# Patient Record
Sex: Male | Born: 2000 | Hispanic: No | Marital: Single | Smoking: Never smoker
Health system: Southern US, Community
[De-identification: ages and names within clinical notes are randomized; demographics above are authoritative.]

---
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
## 2019-09-15 ENCOUNTER — Other Ambulatory Visit: Payer: Medicaid Other

## 2019-09-15 ENCOUNTER — Other Ambulatory Visit: Payer: Self-pay | Admitting: Sleep Medicine

## 2019-09-15 DIAGNOSIS — I471 Supraventricular tachycardia: Secondary | ICD-10-CM

## 2019-09-18 LAB — NOVEL CORONAVIRUS, NAA: SARS-CoV-2, NAA: NOT DETECTED

## 2019-12-19 ENCOUNTER — Other Ambulatory Visit: Payer: Self-pay

## 2019-12-19 DIAGNOSIS — Z20822 Contact with and (suspected) exposure to covid-19: Secondary | ICD-10-CM

## 2019-12-20 LAB — NOVEL CORONAVIRUS, NAA: SARS-CoV-2, NAA: NOT DETECTED

## 2019-12-20 LAB — SARS-COV-2, NAA 2 DAY TAT

## 2023-05-31 ENCOUNTER — Encounter (HOSPITAL_COMMUNITY): Payer: Self-pay

## 2023-05-31 ENCOUNTER — Ambulatory Visit (HOSPITAL_COMMUNITY)
Admission: EM | Admit: 2023-05-31 | Discharge: 2023-05-31 | Disposition: A | Discharge status: Home / Self Care | Attending: Physician Assistant | Admitting: Physician Assistant

## 2023-05-31 DIAGNOSIS — J029 Acute pharyngitis, unspecified: Secondary | ICD-10-CM | POA: Insufficient documentation

## 2023-05-31 DIAGNOSIS — J069 Acute upper respiratory infection, unspecified: Secondary | ICD-10-CM | POA: Diagnosis not present

## 2023-05-31 LAB — POC SARS CORONAVIRUS 2 AG -  ED: SARS Coronavirus 2 Ag: NEGATIVE

## 2023-05-31 LAB — POCT RAPID STREP A (OFFICE): Rapid Strep A Screen: NEGATIVE

## 2023-05-31 MED ORDER — IBUPROFEN 600 MG PO TABS: 600.0000 mg | ORAL_TABLET | Freq: Three times a day (TID) | ORAL | 0 refills | Status: AC | PRN

## 2023-05-31 MED ORDER — PROMETHAZINE-DM 6.25-15 MG/5ML PO SYRP: 5.0000 mL | ORAL_SOLUTION | Freq: Two times a day (BID) | ORAL | 0 refills | Status: AC | PRN

## 2023-05-31 MED ORDER — CETIRIZINE HCL 10 MG PO TABS: 10.0000 mg | ORAL_TABLET | Freq: Every day | ORAL | 0 refills | Status: AC

## 2023-05-31 NOTE — ED Provider Notes (Signed)
 [facility]    CSN: [id number] Arrival date & time: 05/31/23  1014      History   Chief Complaint Chief Complaint  Patient presents with   Sore Throat    HPI Scott Jimenez is a 23 y.o. male.   Patient presents today with a 4-day history of URI symptoms.  He reports initially having a fever but this is since resolved.  He has had congestion, cough, sore throat.  Sore throat pain is rated 5 on a 0-10 pain scale, described as aching, no aggravating alleviating factors identified.  He is eating and drinking normally.  He does report some hoarseness but this is also improved.  Denies any known sick contacts.  He has been taking Mucinex, DayQuil, NyQuil without improvement of symptoms.  He has had COVID last year.  He has had COVID vaccines with booster but has not had most recent booster.  Denies any significant past medical history including allergies, asthma, COPD, diabetes, smoking.    History reviewed. No pertinent past medical history.  There are no active problems to display for this patient.   History reviewed. No pertinent surgical history.     Home Medications    Prior to Admission medications   Medication Sig Start Date End Date Taking? Authorizing Provider  cetirizine (ZYRTEC ALLERGY) 10 MG tablet Take 1 tablet (10 mg total) by mouth daily. 05/31/23  Yes Neilani Duffee K, PA-C  promethazine-dextromethorphan (PROMETHAZINE-DM) 6.25-15 MG/5ML syrup Take 5 mLs by mouth 2 (two) times daily as needed for cough. 05/31/23  Yes Franko Hilliker K, PA-C  ibuprofen  (ADVIL ) 600 MG tablet Take 1 tablet (600 mg total) by mouth every 8 (eight) hours as needed for moderate pain (pain score 4-6). 05/31/23   Rylea Selway, Betsey Brow, PA-C    Family History Family History  Problem Relation Age of Onset   Healthy Mother    Healthy Father     Social History Social History   Tobacco Use   Smoking status: Never  Vaping Use   Vaping status: Never Used  Substance Use Topics    Alcohol use: No   Drug use: No     Allergies   Patient has no known allergies.   Review of Systems Review of Systems  Constitutional:  Positive for activity change and fever (initially but this has resolved). Negative for appetite change and fatigue.  HENT:  Positive for congestion, sore throat and voice change. Negative for postnasal drip, sinus pressure, sneezing and trouble swallowing.   Respiratory:  Positive for cough. Negative for shortness of breath.   Cardiovascular:  Negative for chest pain.  Gastrointestinal:  Negative for abdominal pain, diarrhea, nausea and vomiting.  Neurological:  Negative for dizziness, light-headedness and headaches.     Physical Exam Triage Vital Signs ED Triage Vitals [05/31/23 1031]  Encounter Vitals Group     BP 138/76     Systolic BP Percentile      Diastolic BP Percentile      Pulse Rate 80     Resp 16     Temp 98.9 F (37.2 C)     Temp Source Oral     SpO2 96 %     Weight      Height      Head Circumference      Peak Flow      Pain Score 5     Pain Loc      Pain Education      Exclude from Growth Chart  No data found.  Updated Vital Signs BP 138/76 (BP Location: Right Arm)   Pulse 80   Temp 98.9 F (37.2 C) (Oral)   Resp 16   SpO2 96%   Visual Acuity Right Eye Distance:   Left Eye Distance:   Bilateral Distance:    Right Eye Near:   Left Eye Near:    Bilateral Near:     Physical Exam Vitals reviewed.  Constitutional:      General: He is awake.     Appearance: Normal appearance. He is well-developed. He is not ill-appearing.     Comments: Very pleasant male appears stated age in no acute distress  HENT:     Head: Normocephalic and atraumatic.     Right Ear: Tympanic membrane, ear canal and external ear normal. Tympanic membrane is not erythematous or bulging.     Left Ear: Tympanic membrane, ear canal and external ear normal. Tympanic membrane is not erythematous or bulging.     Nose: Nose normal.      Right Sinus: No maxillary sinus tenderness or frontal sinus tenderness.     Left Sinus: No maxillary sinus tenderness or frontal sinus tenderness.     Mouth/Throat:     Pharynx: Uvula midline. Posterior oropharyngeal erythema and postnasal drip present. No oropharyngeal exudate or uvula swelling.  Cardiovascular:     Rate and Rhythm: Normal rate and regular rhythm.     Heart sounds: Normal heart sounds, S1 normal and S2 normal. No murmur heard. Pulmonary:     Effort: Pulmonary effort is normal. No accessory muscle usage or respiratory distress.     Breath sounds: Normal breath sounds. No stridor. No wheezing, rhonchi or rales.     Comments: Clear to auscultation bilaterally  Neurological:     Mental Status: He is alert.  Psychiatric:        Behavior: Behavior is cooperative.      UC Treatments / Results  Labs (all labs ordered are listed, but only abnormal results are displayed) Labs Reviewed  CULTURE, GROUP A STREP ([facility])  POC SARS CORONAVIRUS 2 AG -  ED  POCT RAPID STREP A (OFFICE)    EKG   Radiology No results found.  Procedures Procedures (including critical care time)  Medications Ordered in UC Medications - No data to display  Initial Impression / Assessment and Plan / UC Course  I have reviewed the triage vital signs and the nursing notes.  Pertinent labs & imaging results that were available during my care of the patient were reviewed by me and considered in my medical decision making (see chart for details).     Patient is well-appearing, afebrile, nontoxic, nontachycardic.  No evidence of acute infection on physical exam that warrant initiation of antibiotics.  COVID test was negative.  Strep testing was negative.  Will send this for culture but defer antibiotics and the culture results are available.  Discussed likely viral etiology.  Will treat symptomatically and he was given ibuprofen  for pain relief with instruction not to take NSAIDs with this  medication.  He was given cetirizine for congestion as well as Promethazine DM for cough.  We discussed that Promethazine DM can be sedating and he is not to drive or drink alcohol taking this medication.  General x-ray was deferred as he had no adventitious lung sounds and his oxygen saturation was 96%.  We discussed that if he is not feeling better within a week or if anything worsens he needs to be seen immediately.  Strict return precautions given.  All questions answered to patient's satisfaction.  Final Clinical Impressions(s) / UC Diagnoses   Final diagnoses:  Viral URI with cough  Sore throat     Discharge Instructions      You are negative for COVID and strep.  We will send your strep off for culture and contact you if this is positive and we need to start antibiotics.  Use cetirizine to help with the congestion.  I also recommend gargling with warm salt water.  Take ibuprofen  for pain relief.  Do not take additional NSAIDs with this medication including aspirin, ibuprofen /Advil , naproxen/Aleve.  Take Promethazine DM for cough.  This will make you sleepy so do not drive or drink alcohol with taking it.  If you are not feeling better within a few days or if anything worsens you should return for reevaluation.   ED Prescriptions     Medication Sig Dispense Auth. Provider   ibuprofen  (ADVIL ) 600 MG tablet Take 1 tablet (600 mg total) by mouth every 8 (eight) hours as needed for moderate pain (pain score 4-6). 30 tablet Rodman Recupero K, PA-C   cetirizine (ZYRTEC ALLERGY) 10 MG tablet Take 1 tablet (10 mg total) by mouth daily. 30 tablet Ellajane Stong K, PA-C   promethazine-dextromethorphan (PROMETHAZINE-DM) 6.25-15 MG/5ML syrup Take 5 mLs by mouth 2 (two) times daily as needed for cough. 118 mL Amalea Ottey K, PA-C      PDMP not reviewed this encounter.   Budd Cargo, PA-C 05/31/23 1148

## 2023-05-31 NOTE — Discharge Instructions (Signed)
 You are negative for COVID and strep.  We will send your strep off for culture and contact you if this is positive and we need to start antibiotics.  Use cetirizine to help with the congestion.  I also recommend gargling with warm salt water.  Take ibuprofen  for pain relief.  Do not take additional NSAIDs with this medication including aspirin, ibuprofen /Advil , naproxen/Aleve.  Take Promethazine DM for cough.  This will make you sleepy so do not drive or drink alcohol with taking it.  If you are not feeling better within a few days or if anything worsens you should return for reevaluation.

## 2023-05-31 NOTE — ED Triage Notes (Signed)
 Patient reports that he has had a sore throat x 4 days.  Patient states he has taken Mucinex, dayquil/Nyquil.

## 2023-06-02 ENCOUNTER — Ambulatory Visit (HOSPITAL_COMMUNITY): Payer: Self-pay

## 2023-06-02 LAB — CULTURE, GROUP A STREP (THRC)
